# Patient Record
Sex: Female | Born: 1960 | Race: Black or African American | Hispanic: No | Marital: Married | Smoking: Never smoker
Health system: Southern US, Community
[De-identification: ages and names within clinical notes are randomized; demographics above are authoritative.]

## PROBLEM LIST (undated history)

## (undated) HISTORY — PX: ABDOMINAL HYSTERECTOMY: SHX81

---

## 2016-11-05 ENCOUNTER — Encounter: Payer: Self-pay | Admitting: *Deleted

## 2016-11-05 ENCOUNTER — Ambulatory Visit
Admission: EM | Admit: 2016-11-05 | Discharge: 2016-11-05 | Disposition: A | Discharge status: Home / Self Care | Payer: 59 | Attending: Family | Admitting: Family

## 2016-11-05 ENCOUNTER — Ambulatory Visit (INDEPENDENT_AMBULATORY_CARE_PROVIDER_SITE_OTHER): Payer: 59

## 2016-11-05 DIAGNOSIS — S96911A Strain of unspecified muscle and tendon at ankle and foot level, right foot, initial encounter: Secondary | ICD-10-CM

## 2016-11-05 DIAGNOSIS — S8001XA Contusion of right knee, initial encounter: Secondary | ICD-10-CM | POA: Diagnosis not present

## 2016-11-05 DIAGNOSIS — M79641 Pain in right hand: Secondary | ICD-10-CM

## 2016-11-05 DIAGNOSIS — W19XXXA Unspecified fall, initial encounter: Secondary | ICD-10-CM

## 2016-11-05 MED ORDER — NAPROXEN 500 MG PO TABS: 500.0000 mg | ORAL_TABLET | Freq: Two times a day (BID) | ORAL | 0 refills | Status: AC | PRN

## 2016-11-05 NOTE — ED Triage Notes (Signed)
[place] [employer] and tripped in parking lot and fell on right hand.  Pt is also having right LE pain since fall. No previous hx of hurt RUE  but does have hx of right sprained ankle.

## 2016-11-05 NOTE — Discharge Instructions (Signed)
Recommend apply ice to areas as needed for swelling and comfort. May wear ace wrap on right ankle for support. Take Naproxen 500mg  every 12 hours as needed for pain and swelling. Follow-up with your primary care provider in 3 to 4 days if not improving.

## 2016-11-05 NOTE — ED Provider Notes (Signed)
CSN: [id number]     Arrival date & time 11/05/16  1044 History   First MD Initiated Contact with Patient 11/05/16 1116     Chief Complaint  Patient presents with  . Right Hand Swelling   (Consider location/radiation/quality/duration/timing/severity/associated sxs/prior Treatment) 56 year old female presents with injuries after falling yesterday. She stepped out of [employer] onto a curb and missed a step. She fell and landed on her right hand, right knee and right lower leg/ankle. She was able to move all extremities right after the accident. Later that evening she started experiencing more pain and swelling in those areas. She has not taken any medication for pain/swelling. She has no chronic health issues and takes no daily medication.    The history is provided by the patient.    No past medical history on file. Past Surgical History:  Procedure Laterality Date  . ABDOMINAL HYSTERECTOMY     Family History  Problem Relation Age of Onset  . Heart murmur Mother   . Stroke Father    Social History  Substance Use Topics  . Smoking status: Never Smoker  . Smokeless tobacco: Never Used  . Alcohol use No   OB History    No data available     Review of Systems  Constitutional: Negative for chills, fatigue and fever.  Eyes: Negative for visual disturbance.  Respiratory: Negative for chest tightness, shortness of breath and wheezing.   Cardiovascular: Positive for leg swelling (right ankle). Negative for chest pain.  Gastrointestinal: Negative for diarrhea, nausea and vomiting.  Genitourinary: Negative for difficulty urinating and flank pain.  Musculoskeletal: Positive for arthralgias, joint swelling and myalgias. Negative for back pain and neck pain.  Skin: Positive for color change (right ankle). Negative for rash and wound.  Allergic/Immunologic: Negative for immunocompromised state.  Neurological: Negative for dizziness, tremors, seizures, syncope, weakness, light-headedness,  numbness and headaches.  Hematological: Negative for adenopathy. Does not bruise/bleed easily.    Allergies  Patient has no known allergies.  Home Medications   Prior to Admission medications   Medication Sig Start Date End Date Taking? Authorizing Provider  naproxen (NAPROSYN) 500 MG tablet Take 1 tablet (500 mg total) by mouth 2 (two) times daily as needed for moderate pain. 11/05/16   Sudie Grumbling, NP   Meds Ordered and Administered this Visit  Medications - No data to display  BP 107/68 (BP Location: Left Arm)   Pulse 77   Temp 98.3 F (36.8 C) (Oral)   Ht 5\' 8"  (1.727 m)   Wt 205 lb (93 kg)   SpO2 100%   BMI 31.17 kg/m  No data found.   Physical Exam  Constitutional: She is oriented to person, place, and time. She appears well-developed and well-nourished. No distress.  HENT:  Head: Normocephalic and atraumatic.  Right Ear: External ear normal.  Left Ear: External ear normal.  Neck: Normal range of motion. Neck supple.  Cardiovascular: Normal rate and regular rhythm.   Pulmonary/Chest: Effort normal and breath sounds normal.  Musculoskeletal: She exhibits edema and tenderness.       Right knee: She exhibits swelling. She exhibits normal range of motion, no effusion, no ecchymosis, no deformity, no laceration, no erythema and normal alignment. Tenderness found. Medial joint line tenderness noted.       Right ankle: She exhibits decreased range of motion, swelling and ecchymosis. She exhibits no deformity, no laceration and normal pulse. Tenderness. Lateral malleolus tenderness found. Achilles tendon normal.  Right hand: She exhibits tenderness. She exhibits normal range of motion, normal two-point discrimination, normal capillary refill, no deformity, no laceration and no swelling. Normal sensation noted. Normal strength noted.       Hands:      Legs:      Feet:  Right hand- full range of motion with minimal pain. Tender along the palmar area from wrist to  base of phalanges. Good capillary refill. No neuro deficits.   Right knee- slight swelling present on medial aspect of knee and tender. Has full range of motion.  Right ankle- decreased range of motion- especially with flexion and rotation. Swelling and tender along lateral malleolus. Slight bruising present. Good pulses and capillary refill. No neuro deficits noted.   Neurological: She is alert and oriented to person, place, and time. She has normal strength. No sensory deficit.  Skin: Skin is warm and dry.  Psychiatric: She has a normal mood and affect. Her behavior is normal. Judgment and thought content normal.    Urgent Care Course     Procedures (including critical care time)  Labs Review Labs Reviewed - No data to display  Imaging Review Dg Ankle Complete Right  Result Date: 11/05/2016 CLINICAL DATA:  Pain after trauma EXAM: RIGHT ANKLE - COMPLETE 3+ VIEW COMPARISON:  None. FINDINGS: There is no evidence of fracture, dislocation, or joint effusion. There is no evidence of arthropathy or other focal bone abnormality. Soft tissues are unremarkable. IMPRESSION: Negative. Electronically Signed   By: Gerome Samavid  Williams III M.D   On: 11/05/2016 11:57   Dg Hand Complete Right  Result Date: 11/05/2016 CLINICAL DATA:  Pain after trauma yesterday. EXAM: RIGHT HAND - COMPLETE 3+ VIEW COMPARISON:  None. FINDINGS: There is an unusual configuration to the distal third finger with slight deviation of the distal third finger in an ulnar direction. This may not be acute however given the reported symptoms of medial hand pain. Recommend clinical correlation. No fractures are seen. IMPRESSION: There is an unusual configuration of the distal third finger with slight deviation in an ulnar direction. This may not be acute given the symptoms of medial hand pain. Recommend clinical correlation. No fractures identified. Electronically Signed   By: Gerome Samavid  Williams III M.D   On: 11/05/2016 11:56     Visual  Acuity Review  Right Eye Distance:   Left Eye Distance:   Bilateral Distance:    Right Eye Near:   Left Eye Near:    Bilateral Near:         MDM   1. Right hand pain   2. Right ankle strain, initial encounter   3. Contusion of right knee, initial encounter    Reviewed x-ray results with patient. Deviation of 3rd finger on right hand is not new. No fractures or dislocations. Discussed that she probably sprained and bruised her hand, knee and ankle. Recommend Naproxen 500mg  twice a day as needed for pain and swelling. Wear ace wrap for support for ankle. Elevate area as much as possible. Follow-up with her primary care provider in 3 to 4 days if not improving.      Sudie GrumblingAmyot, Bora Bost Berry, NP 11/06/16 [phone]

## 2018-10-26 IMAGING — CR DG HAND COMPLETE 3+V*R*
3 series · 3 of 3 positions shown · non-contrast
Comparison: None.

CLINICAL DATA: Pain after trauma yesterday.

EXAM:
RIGHT HAND - COMPLETE 3+ VIEW

[hand ap]
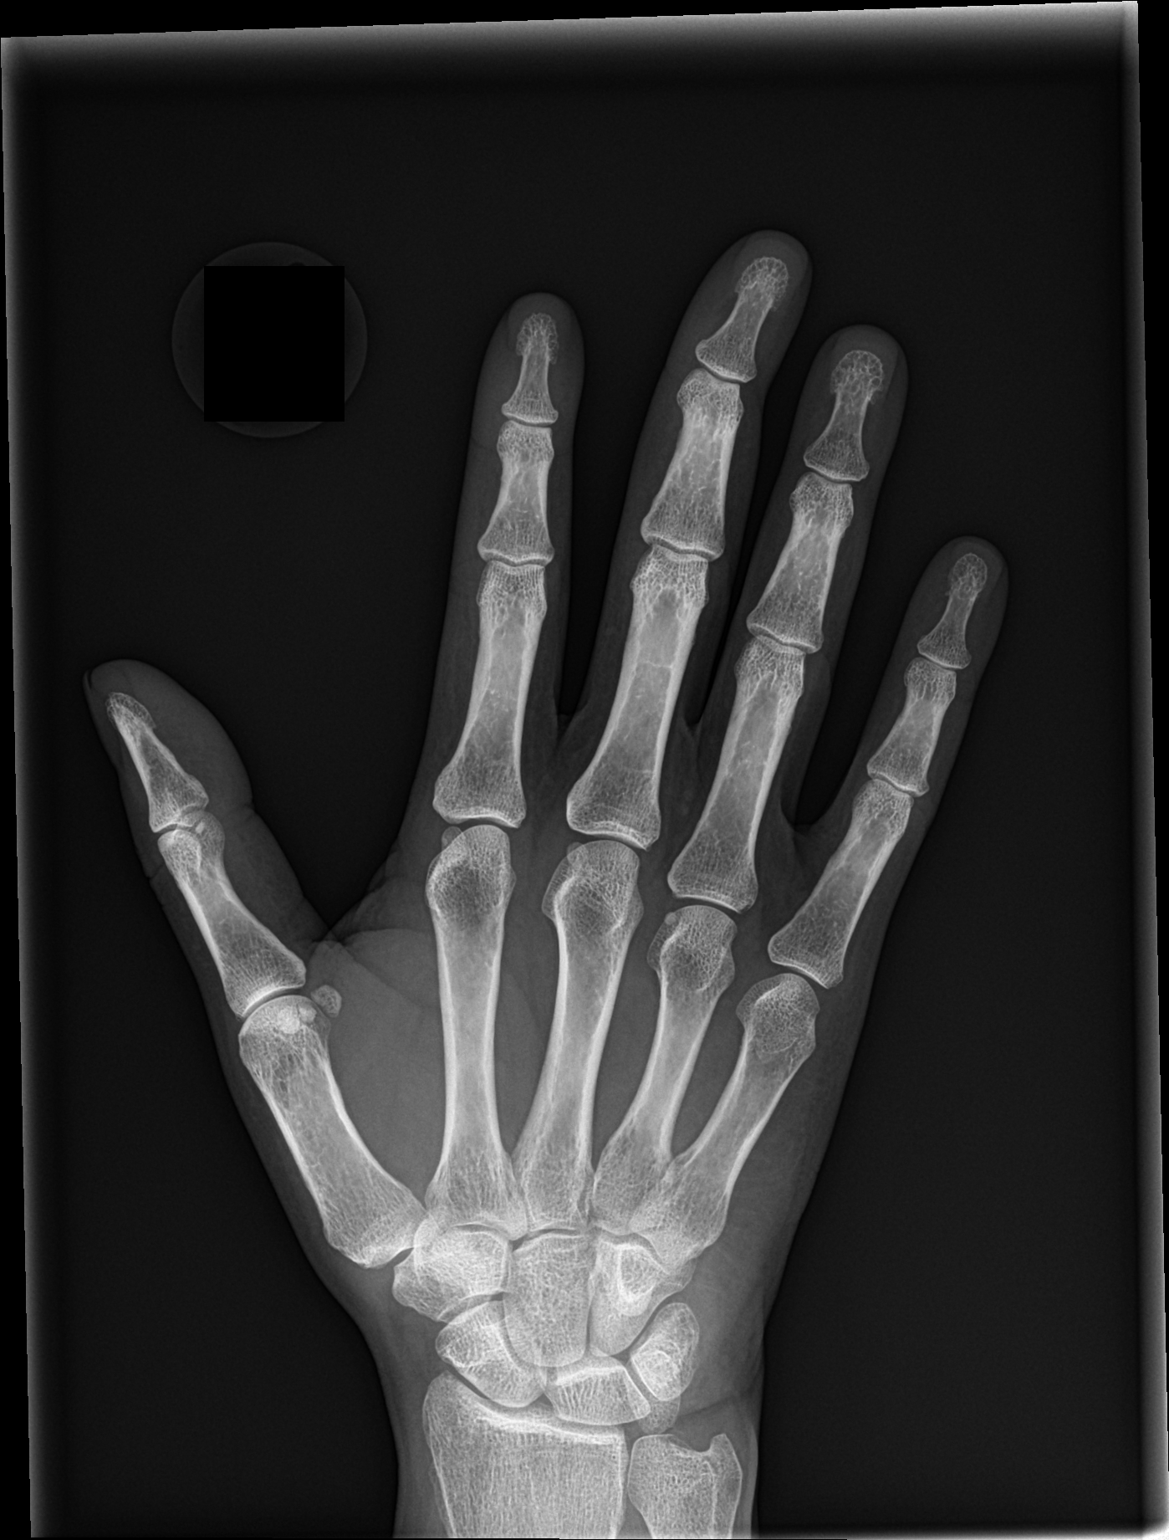

[hand obl]
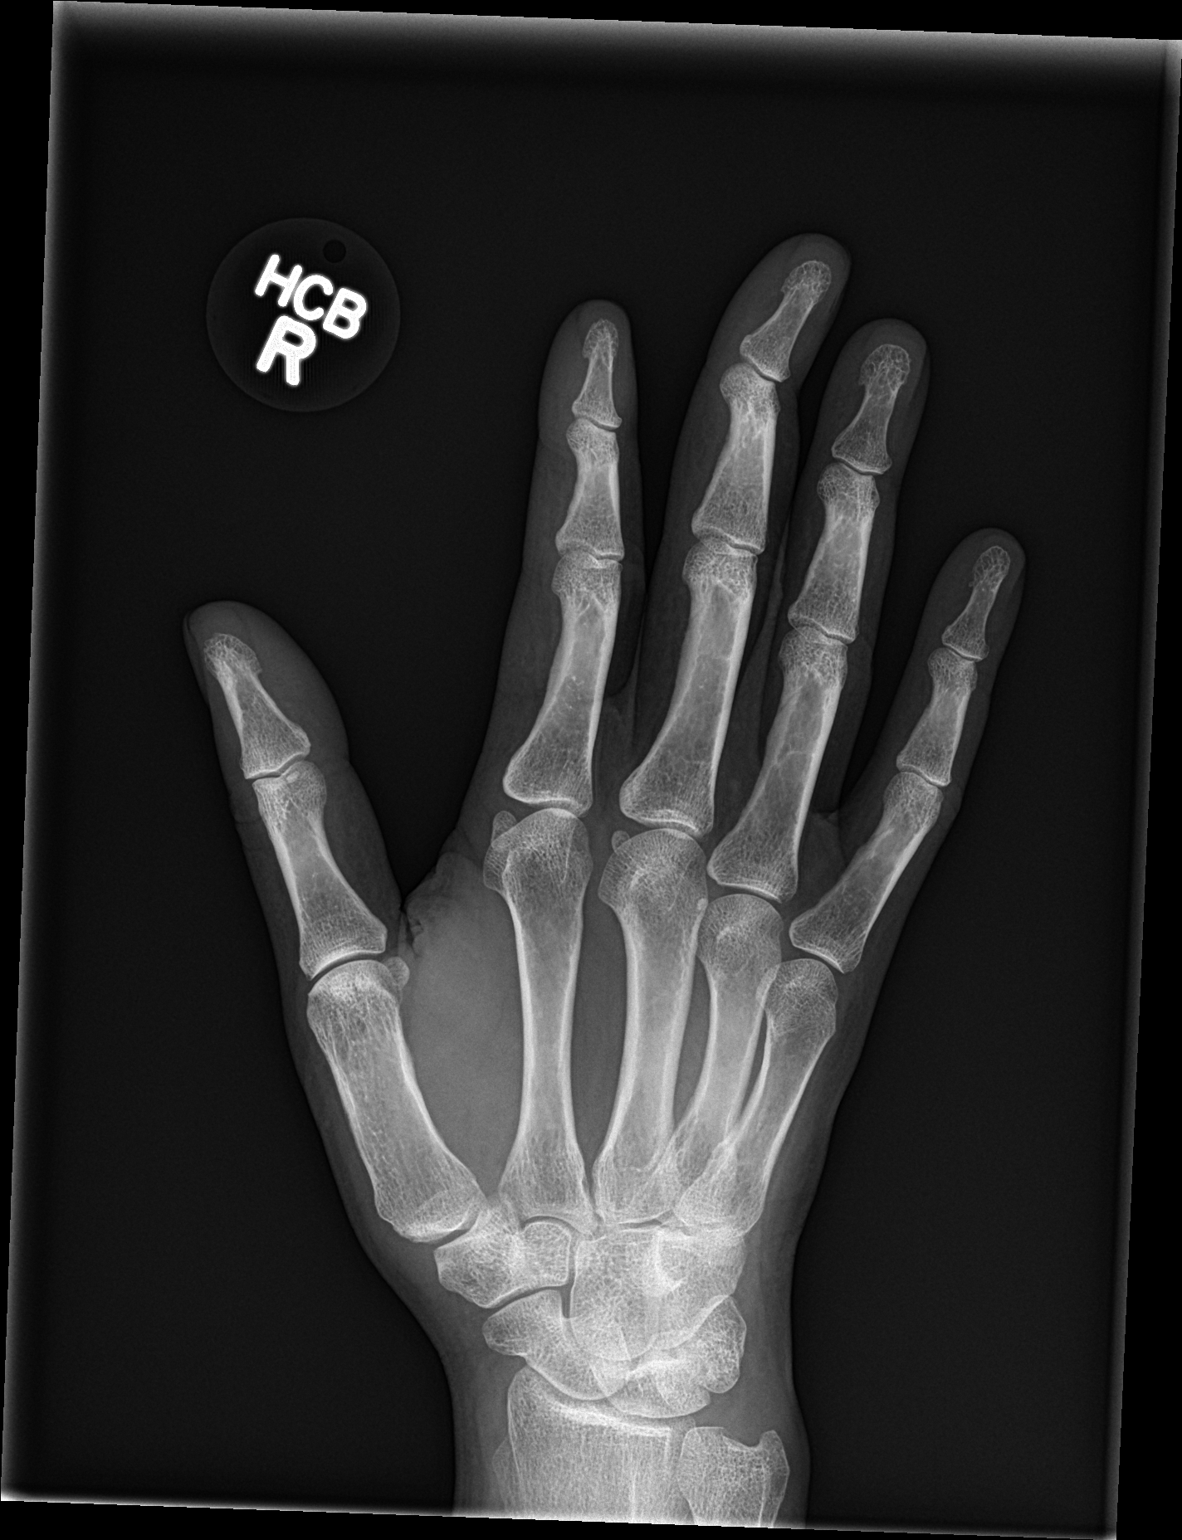

[hand lat]
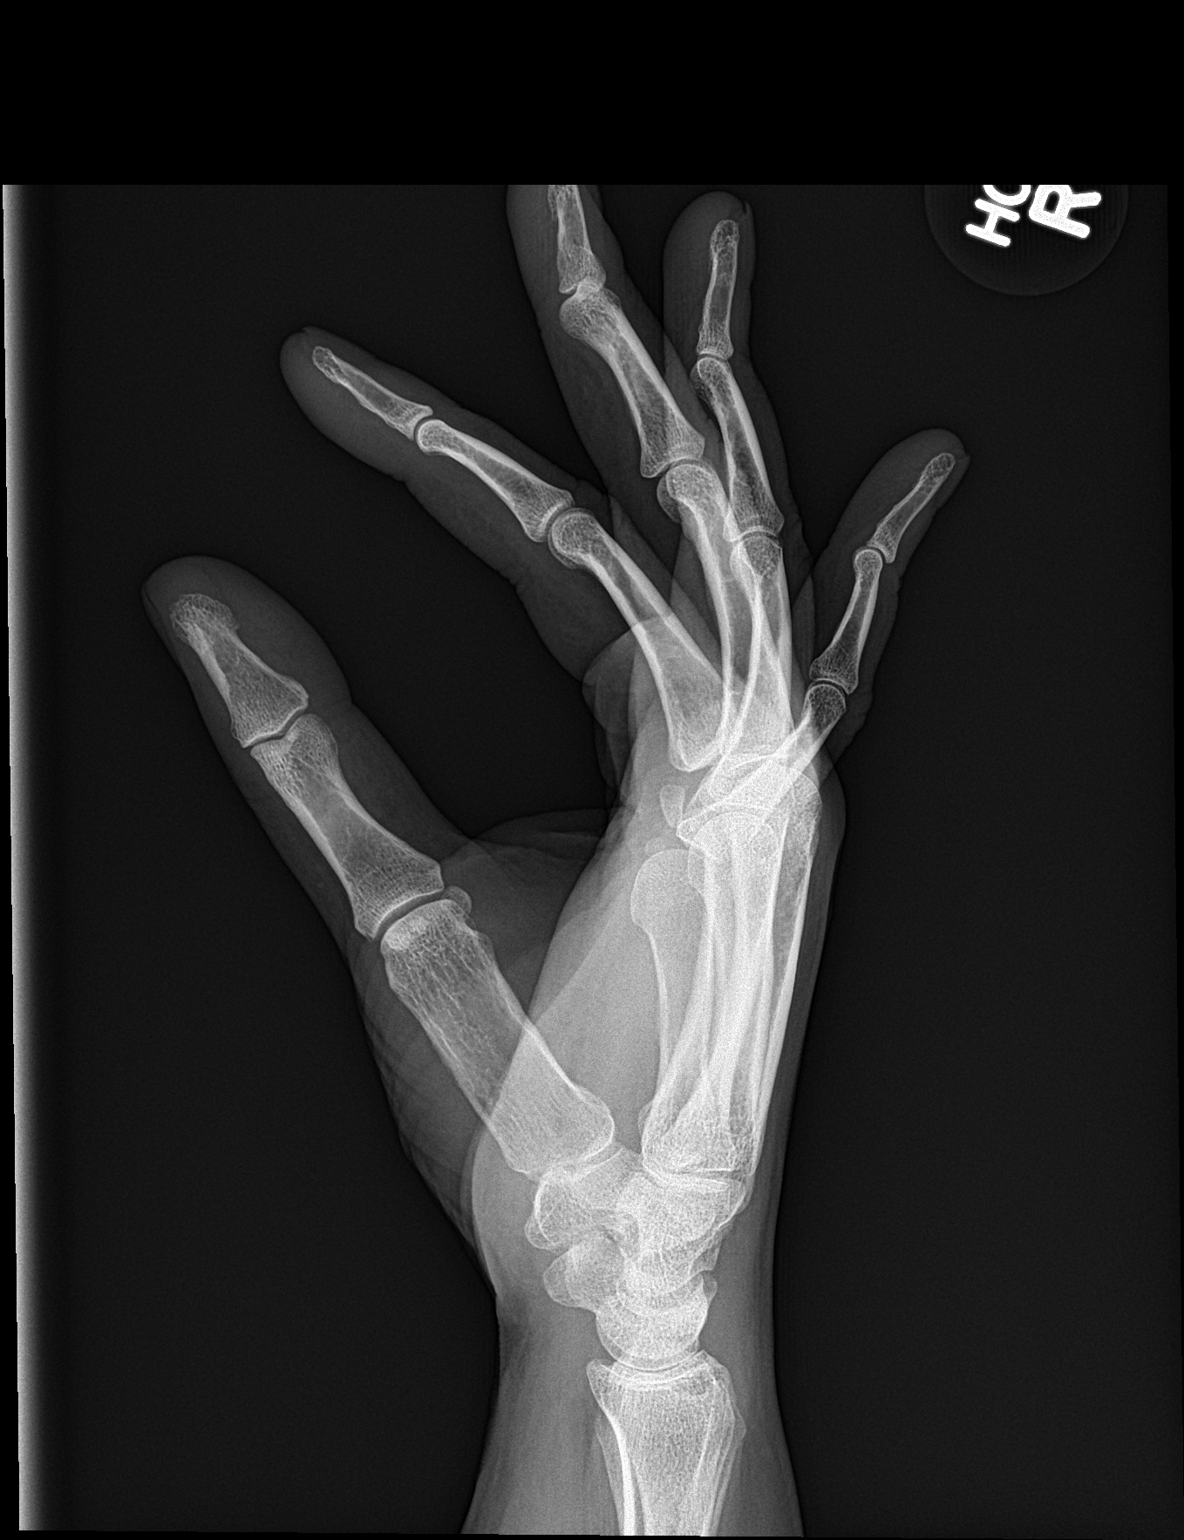

[3 of 3 positions shown; findings below may reference images not displayed]

FINDINGS: There is an unusual configuration to the distal third finger with
slight deviation of the distal third finger in an ulnar direction.
This may not be acute however given the reported symptoms of medial
hand pain. Recommend clinical correlation. No fractures are seen.
IMPRESSION: There is an unusual configuration of the distal third finger with
slight deviation in an ulnar direction. This may not be acute given
the symptoms of medial hand pain. Recommend clinical correlation. No
fractures identified.

## 2021-01-14 DEATH — deceased

## 2021-03-16 ENCOUNTER — Encounter: Payer: Self-pay | Admitting: Internal Medicine
